# Patient Record
Sex: Female | Born: 1965 | Race: White | Hispanic: No | Marital: Married | Smoking: Never smoker
Health system: Southern US, Community
[De-identification: ages and names within clinical notes are randomized; demographics above are authoritative.]

## PROBLEM LIST (undated history)

## (undated) DIAGNOSIS — M549 Dorsalgia, unspecified: Secondary | ICD-10-CM

## (undated) DIAGNOSIS — Z01419 Encounter for gynecological examination (general) (routine) without abnormal findings: Secondary | ICD-10-CM

## (undated) HISTORY — DX: Dorsalgia, unspecified: M54.9

## (undated) HISTORY — DX: Encounter for gynecological examination (general) (routine) without abnormal findings: Z01.419

---

## 1997-12-31 ENCOUNTER — Other Ambulatory Visit: Admission: RE | Admit: 1997-12-31 | Discharge: 1997-12-31 | Payer: Self-pay | Admitting: Obstetrics and Gynecology

## 1999-04-20 ENCOUNTER — Other Ambulatory Visit: Admission: RE | Admit: 1999-04-20 | Discharge: 1999-04-20 | Payer: Self-pay | Admitting: Obstetrics and Gynecology

## 2000-06-06 ENCOUNTER — Other Ambulatory Visit: Admission: RE | Admit: 2000-06-06 | Discharge: 2000-06-06 | Payer: Self-pay | Admitting: Obstetrics and Gynecology

## 2001-08-26 ENCOUNTER — Other Ambulatory Visit: Admission: RE | Admit: 2001-08-26 | Discharge: 2001-08-26 | Payer: Self-pay | Admitting: Internal Medicine

## 2012-03-04 ENCOUNTER — Other Ambulatory Visit: Payer: Self-pay | Admitting: Family Medicine

## 2012-03-04 DIAGNOSIS — Z1231 Encounter for screening mammogram for malignant neoplasm of breast: Secondary | ICD-10-CM

## 2012-03-26 ENCOUNTER — Ambulatory Visit
Admission: RE | Admit: 2012-03-26 | Discharge: 2012-03-26 | Disposition: A | Discharge status: Home / Self Care | Payer: 59 | Source: Ambulatory Visit | Attending: Family Medicine | Admitting: Family Medicine

## 2012-03-26 DIAGNOSIS — Z1231 Encounter for screening mammogram for malignant neoplasm of breast: Secondary | ICD-10-CM

## 2013-05-16 ENCOUNTER — Other Ambulatory Visit: Payer: Self-pay

## 2013-05-16 DIAGNOSIS — Z1231 Encounter for screening mammogram for malignant neoplasm of breast: Secondary | ICD-10-CM

## 2013-06-18 ENCOUNTER — Ambulatory Visit: Payer: 59

## 2013-07-23 ENCOUNTER — Ambulatory Visit
Admission: RE | Admit: 2013-07-23 | Discharge: 2013-07-23 | Disposition: A | Discharge status: Home / Self Care | Payer: PRIVATE HEALTH INSURANCE | Source: Ambulatory Visit

## 2013-07-23 DIAGNOSIS — Z1231 Encounter for screening mammogram for malignant neoplasm of breast: Secondary | ICD-10-CM

## 2014-06-22 ENCOUNTER — Other Ambulatory Visit: Payer: Self-pay

## 2014-06-22 DIAGNOSIS — Z1231 Encounter for screening mammogram for malignant neoplasm of breast: Secondary | ICD-10-CM

## 2014-07-30 ENCOUNTER — Ambulatory Visit: Payer: PRIVATE HEALTH INSURANCE

## 2014-09-24 ENCOUNTER — Ambulatory Visit
Admission: RE | Admit: 2014-09-24 | Discharge: 2014-09-24 | Disposition: A | Discharge status: Home / Self Care | Payer: 59 | Source: Ambulatory Visit

## 2014-09-24 DIAGNOSIS — Z1231 Encounter for screening mammogram for malignant neoplasm of breast: Secondary | ICD-10-CM

## 2014-10-20 ENCOUNTER — Encounter: Payer: Self-pay | Admitting: *Deleted

## 2014-10-22 ENCOUNTER — Ambulatory Visit (INDEPENDENT_AMBULATORY_CARE_PROVIDER_SITE_OTHER): Payer: 59 | Admitting: Interventional Cardiology

## 2014-10-22 ENCOUNTER — Encounter: Payer: Self-pay | Admitting: Interventional Cardiology

## 2014-10-22 VITALS — BP 122/80 | HR 84 | Ht 65.0 in | Wt 116.0 lb

## 2014-10-22 DIAGNOSIS — R079 Chest pain, unspecified: Secondary | ICD-10-CM | POA: Diagnosis not present

## 2014-10-22 DIAGNOSIS — R0602 Shortness of breath: Secondary | ICD-10-CM

## 2014-10-22 DIAGNOSIS — R9431 Abnormal electrocardiogram [ECG] [EKG]: Secondary | ICD-10-CM | POA: Diagnosis not present

## 2014-10-22 NOTE — Patient Instructions (Signed)
Your physician recommends that you continue on your current medications as directed. Please refer to the Current Medication list given to you today.    Your physician has requested that you have a stress echocardiogram. For further information please visit [email or web address]. Please follow instruction sheet as given.  FOLLOW UP WITH DR Irish Lack AS NEEDED

## 2014-10-22 NOTE — Progress Notes (Signed)
Patient ID: Marissa Ward, female   DOB: March 04, 1966, 49 y.o.   MRN: [id number]     Cardiology Office Note   Date:  10/22/2014   ID:  Marissa Ward, DOB [birth year], MRN [id number]  PCP:  Lujean Amel, MD    No chief complaint on file. Chest tightness   Wt Readings from Last 3 Encounters:  10/22/14 116 lb (52.617 kg)       History of Present Illness: Marissa Ward is a 49 y.o. female  Who had an episode of chest tightness in early March that felt like GERD.  She had been taking Lunesta at that time.  She stopped the medicine.  She had one further episode of tightness that felt like GERD, but not as severe as the first time.   First episode was intermittent for the day.  No associated sweating or nausea.  No lightheadedness.    She walks occasionally and has no issues there.  No problems with walking stairs in her house.  She feels that she is out of shape from lack of exercise.  There has been no change in her exercise tolerance.   No prior cardiac testing. No problems walking a treadmill.  She was told that she had MVP 25 years ago.  She took antibiotic for several years.  She stopped the antibiotics.    She has noticed that she gets very [facility] with running-  This has been an issue for years.    Past Medical History  Diagnosis Date  . Visit for routine gyn exam   . Back pain     No past surgical history on file.   Current Outpatient Prescriptions  Medication Sig Dispense Refill  . ibuprofen (ADVIL,MOTRIN) 200 MG tablet Take 200 mg by mouth every 6 (six) hours as needed.    . norethindrone-ethinyl estradiol (MICROGESTIN,JUNEL,LOESTRIN) 1-20 MG-MCG tablet Take 1 tablet by mouth daily. 3 WEEKS WITH 1 WEEK OFF    . eszopiclone (LUNESTA) 2 MG TABS tablet Take 2 mg by mouth at bedtime as needed for sleep. TAKE IN PLACE OF 3 MG DOSE Take immediately before bedtime    . Eszopiclone 3 MG TABS Take 3 mg by mouth at bedtime. Take immediately before bedtime ONCE A  DAY ALTERNATING WITH 2 MG - 2 1/2 MG AT BEDTIME     No current facility-administered medications for this visit.    Allergies:   Ciprofloxacin and Sulfa antibiotics    Social History:  The patient  reports that she has never smoked. She does not have any smokeless tobacco history on file. She reports that she drinks alcohol. She reports that she does not use illicit drugs.   Family History:  The patient's family history includes Arrhythmia in her mother; Depression in her mother; Heart disease in her mother; Hypertension in her father; Thyroid disease in her mother. Mother saw a cardiologist during the last 2 months of her life. No early CAD.  Siblings are healthy.    ROS:  Please see the history of present illness.   Otherwise, review of systems are positive for chronic red eyes.  She does not sleep well.   All other systems are reviewed and negative.    PHYSICAL EXAM: VS:  BP 122/80 mmHg  Pulse 84  Ht 5\' 5"  (1.651 m)  Wt 116 lb (52.617 kg)  BMI 19.30 kg/m2 , BMI Body mass index is 19.3 kg/(m^2). GEN: Well nourished, well developed, in no acute distress HEENT: normal Neck: no JVD, carotid  bruits, or masses Cardiac: RRR; no murmurs, rubs, or gallops,no edema  Respiratory:  clear to auscultation bilaterally, normal work of breathing GI: soft, nontender, nondistended, + BS MS: no deformity or atrophy Skin: warm and dry, no rash Neuro:  Strength and sensation are intact Psych: euthymic mood, full affect   EKG:   The ekg ordered today demonstrates NSR, septal Q waves   Recent Labs: No results found for requested labs within last 365 days.   Lipid Panel No results found for: CHOL, TRIG, HDL, CHOLHDL, VLDL, LDLCALC, LDLDIRECT   Other studies Reviewed: Additional studies/ records that were reviewed today with results demonstrating: none.   ASSESSMENT AND PLAN:  1. Atypical chest pain: May be GI related per her description. We'll plan on exercise stress echo to evaluate  exercise capacity in the setting of shortness of breath, chest discomfort and abnormal ECG. She has a low pretest probability of coronary disease given her lack of risk factors. Long-term, she would benefit from getting back and exercise. This test will help [place] to give her an exercise prescription.  2. Shortness of breath: May be due from deconditioning due to lack of exercise. 3. Abnormal ECG: Septal Q waves noted in leads V1 to V3. Some mild ST segment changes noted in V1 and V2. Check for wall motion abnormalities with the echo portion of the stress echo. No sign of any valvular disease on her exam, despite her history of mitral valve prolapse. No indication for SBE prophylaxis.   Current medicines are reviewed at length with the patient today.  The patient concerns regarding her medicines were addressed.  The following changes have been made:  No change  Labs/ tests ordered today include: stress echo   Orders Placed This Encounter  Procedures  . EKG 12-Lead  . Echocardiogram stress test withOUT contrast    Recommend 150 minutes/week of aerobic exercise Low fat, low carb, high fiber diet recommended  Disposition:   FU for stress test   Teresita Madura., MD  10/22/2014 11:14 AM    [facility] [place], [place],   [address] Phone: [phone]; Fax: [phone]

## 2014-11-06 ENCOUNTER — Ambulatory Visit (HOSPITAL_COMMUNITY): Payer: 59 | Attending: Cardiology

## 2014-11-06 DIAGNOSIS — R079 Chest pain, unspecified: Secondary | ICD-10-CM | POA: Diagnosis present

## 2014-11-06 DIAGNOSIS — I341 Nonrheumatic mitral (valve) prolapse: Secondary | ICD-10-CM | POA: Diagnosis not present

## 2014-11-06 DIAGNOSIS — R0600 Dyspnea, unspecified: Secondary | ICD-10-CM | POA: Diagnosis not present

## 2014-11-06 DIAGNOSIS — R0602 Shortness of breath: Secondary | ICD-10-CM | POA: Insufficient documentation

## 2014-11-06 NOTE — Progress Notes (Signed)
Stress Echo performed.

## 2014-11-11 ENCOUNTER — Other Ambulatory Visit (HOSPITAL_COMMUNITY): Payer: 59

## 2015-09-01 ENCOUNTER — Other Ambulatory Visit: Payer: Self-pay

## 2015-09-01 DIAGNOSIS — Z1231 Encounter for screening mammogram for malignant neoplasm of breast: Secondary | ICD-10-CM

## 2015-09-30 ENCOUNTER — Ambulatory Visit
Admission: RE | Admit: 2015-09-30 | Discharge: 2015-09-30 | Disposition: A | Discharge status: Home / Self Care | Payer: 59 | Source: Ambulatory Visit

## 2015-09-30 DIAGNOSIS — Z1231 Encounter for screening mammogram for malignant neoplasm of breast: Secondary | ICD-10-CM

## 2016-08-22 ENCOUNTER — Other Ambulatory Visit: Payer: Self-pay | Admitting: Family Medicine

## 2016-08-22 DIAGNOSIS — Z1231 Encounter for screening mammogram for malignant neoplasm of breast: Secondary | ICD-10-CM

## 2016-10-05 ENCOUNTER — Ambulatory Visit: Payer: 59

## 2016-10-20 ENCOUNTER — Ambulatory Visit
Admission: RE | Admit: 2016-10-20 | Discharge: 2016-10-20 | Disposition: A | Discharge status: Home / Self Care | Payer: BLUE CROSS/BLUE SHIELD | Source: Ambulatory Visit | Attending: Family Medicine | Admitting: Family Medicine

## 2016-10-20 DIAGNOSIS — Z1231 Encounter for screening mammogram for malignant neoplasm of breast: Secondary | ICD-10-CM

## 2017-01-12 DIAGNOSIS — Z01419 Encounter for gynecological examination (general) (routine) without abnormal findings: Secondary | ICD-10-CM | POA: Diagnosis not present

## 2017-02-08 DIAGNOSIS — Z01419 Encounter for gynecological examination (general) (routine) without abnormal findings: Secondary | ICD-10-CM | POA: Diagnosis not present

## 2017-03-14 DIAGNOSIS — H1032 Unspecified acute conjunctivitis, left eye: Secondary | ICD-10-CM | POA: Diagnosis not present

## 2017-03-23 DIAGNOSIS — H01001 Unspecified blepharitis right upper eyelid: Secondary | ICD-10-CM | POA: Diagnosis not present

## 2017-03-23 DIAGNOSIS — H40013 Open angle with borderline findings, low risk, bilateral: Secondary | ICD-10-CM | POA: Diagnosis not present

## 2017-03-23 DIAGNOSIS — H01004 Unspecified blepharitis left upper eyelid: Secondary | ICD-10-CM | POA: Diagnosis not present

## 2017-04-20 DIAGNOSIS — Z23 Encounter for immunization: Secondary | ICD-10-CM | POA: Diagnosis not present

## 2017-06-28 DIAGNOSIS — D225 Melanocytic nevi of trunk: Secondary | ICD-10-CM | POA: Diagnosis not present

## 2017-06-28 DIAGNOSIS — Q825 Congenital non-neoplastic nevus: Secondary | ICD-10-CM | POA: Diagnosis not present

## 2017-06-28 DIAGNOSIS — L821 Other seborrheic keratosis: Secondary | ICD-10-CM | POA: Diagnosis not present

## 2017-10-25 ENCOUNTER — Other Ambulatory Visit: Payer: Self-pay | Admitting: Family Medicine

## 2017-10-25 DIAGNOSIS — Z1231 Encounter for screening mammogram for malignant neoplasm of breast: Secondary | ICD-10-CM

## 2017-11-16 ENCOUNTER — Ambulatory Visit
Admission: RE | Admit: 2017-11-16 | Discharge: 2017-11-16 | Disposition: A | Discharge status: Home / Self Care | Payer: 59 | Source: Ambulatory Visit | Attending: Family Medicine | Admitting: Family Medicine

## 2017-11-16 DIAGNOSIS — Z1231 Encounter for screening mammogram for malignant neoplasm of breast: Secondary | ICD-10-CM | POA: Diagnosis not present

## 2018-01-14 DIAGNOSIS — Z1389 Encounter for screening for other disorder: Secondary | ICD-10-CM | POA: Diagnosis not present

## 2018-01-14 DIAGNOSIS — Z130 Encounter for screening for diseases of the blood and blood-forming organs and certain disorders involving the immune mechanism: Secondary | ICD-10-CM | POA: Diagnosis not present

## 2018-01-14 DIAGNOSIS — Z01419 Encounter for gynecological examination (general) (routine) without abnormal findings: Secondary | ICD-10-CM | POA: Diagnosis not present

## 2018-04-29 DIAGNOSIS — R399 Unspecified symptoms and signs involving the genitourinary system: Secondary | ICD-10-CM | POA: Diagnosis not present

## 2018-07-25 DIAGNOSIS — Z1211 Encounter for screening for malignant neoplasm of colon: Secondary | ICD-10-CM | POA: Diagnosis not present

## 2018-10-07 ENCOUNTER — Other Ambulatory Visit: Payer: Self-pay | Admitting: Family Medicine

## 2018-10-07 DIAGNOSIS — Z1231 Encounter for screening mammogram for malignant neoplasm of breast: Secondary | ICD-10-CM

## 2018-11-29 ENCOUNTER — Ambulatory Visit: Payer: 59

## 2018-12-19 ENCOUNTER — Ambulatory Visit: Payer: 59

## 2019-01-20 DIAGNOSIS — Z130 Encounter for screening for diseases of the blood and blood-forming organs and certain disorders involving the immune mechanism: Secondary | ICD-10-CM | POA: Diagnosis not present

## 2019-01-20 DIAGNOSIS — Z1389 Encounter for screening for other disorder: Secondary | ICD-10-CM | POA: Diagnosis not present

## 2019-01-20 DIAGNOSIS — Z01419 Encounter for gynecological examination (general) (routine) without abnormal findings: Secondary | ICD-10-CM | POA: Diagnosis not present

## 2019-01-20 DIAGNOSIS — Z124 Encounter for screening for malignant neoplasm of cervix: Secondary | ICD-10-CM | POA: Diagnosis not present

## 2019-01-29 ENCOUNTER — Ambulatory Visit: Payer: 59

## 2019-01-29 DIAGNOSIS — Z0000 Encounter for general adult medical examination without abnormal findings: Secondary | ICD-10-CM | POA: Diagnosis not present

## 2019-01-31 DIAGNOSIS — N959 Unspecified menopausal and perimenopausal disorder: Secondary | ICD-10-CM | POA: Diagnosis not present

## 2019-01-31 DIAGNOSIS — Z13220 Encounter for screening for lipoid disorders: Secondary | ICD-10-CM | POA: Diagnosis not present

## 2019-01-31 DIAGNOSIS — R5382 Chronic fatigue, unspecified: Secondary | ICD-10-CM | POA: Diagnosis not present

## 2019-01-31 DIAGNOSIS — E611 Iron deficiency: Secondary | ICD-10-CM | POA: Diagnosis not present

## 2019-01-31 DIAGNOSIS — Z5181 Encounter for therapeutic drug level monitoring: Secondary | ICD-10-CM | POA: Diagnosis not present

## 2019-03-25 ENCOUNTER — Other Ambulatory Visit: Payer: Self-pay

## 2019-03-25 ENCOUNTER — Ambulatory Visit
Admission: RE | Admit: 2019-03-25 | Discharge: 2019-03-25 | Disposition: A | Discharge status: Home / Self Care | Payer: 59 | Source: Ambulatory Visit | Attending: Family Medicine | Admitting: Family Medicine

## 2019-03-25 DIAGNOSIS — Z1231 Encounter for screening mammogram for malignant neoplasm of breast: Secondary | ICD-10-CM

## 2019-04-09 DIAGNOSIS — R69 Illness, unspecified: Secondary | ICD-10-CM | POA: Diagnosis not present

## 2019-05-25 DIAGNOSIS — Z20828 Contact with and (suspected) exposure to other viral communicable diseases: Secondary | ICD-10-CM | POA: Diagnosis not present

## 2019-06-25 DIAGNOSIS — R69 Illness, unspecified: Secondary | ICD-10-CM | POA: Diagnosis not present

## 2019-09-13 ENCOUNTER — Ambulatory Visit: Payer: 59 | Attending: Internal Medicine

## 2019-09-13 DIAGNOSIS — Z23 Encounter for immunization: Secondary | ICD-10-CM | POA: Insufficient documentation

## 2019-09-13 NOTE — Progress Notes (Signed)
   Covid-19 Vaccination Clinic  Name:  Marissa Ward    MRN: [id number] DOB: 18-Jun-1966  09/13/2019  Marissa Ward was observed post Covid-19 immunization for 15 minutes without incidence. She was provided with Vaccine Information Sheet and instruction to access the V-Safe system.   Marissa Ward was instructed to call 911 with any severe reactions post vaccine: Marland Kitchen Difficulty breathing  . Swelling of your face and throat  . A fast heartbeat  . A bad rash all over your body  . Dizziness and weakness    Immunizations Administered    Name Date Dose VIS Date Route   Pfizer COVID-19 Vaccine 09/13/2019  9:38 AM 0.3 mL 06/27/2019 Intramuscular   Manufacturer: [place]   Lot: [id number]   [place]: [id number]

## 2019-09-17 DIAGNOSIS — R69 Illness, unspecified: Secondary | ICD-10-CM | POA: Diagnosis not present

## 2019-10-04 ENCOUNTER — Ambulatory Visit: Payer: 59 | Attending: Internal Medicine

## 2019-10-04 DIAGNOSIS — Z23 Encounter for immunization: Secondary | ICD-10-CM

## 2019-10-04 NOTE — Progress Notes (Signed)
   Covid-19 Vaccination Clinic  Name:  Marissa Ward    MRN: [id number] DOB: 13-Feb-1966  10/04/2019  Marissa Ward was observed post Covid-19 immunization for 15 minutes without incident. She was provided with Vaccine Information Sheet and instruction to access the V-Safe system.   Marissa Ward was instructed to call 911 with any severe reactions post vaccine: Marland Kitchen Difficulty breathing  . Swelling of face and throat  . A fast heartbeat  . A bad rash all over body  . Dizziness and weakness   Immunizations Administered    Name Date Dose VIS Date Route   Pfizer COVID-19 Vaccine 10/04/2019 10:22 AM 0.3 mL 06/27/2019 Intramuscular   Manufacturer: [place]   Lot: [id number]   [place]: [id number]

## 2019-10-08 ENCOUNTER — Ambulatory Visit: Payer: 59

## 2020-03-02 ENCOUNTER — Other Ambulatory Visit: Payer: Self-pay | Admitting: Family Medicine

## 2020-03-02 DIAGNOSIS — Z1231 Encounter for screening mammogram for malignant neoplasm of breast: Secondary | ICD-10-CM

## 2020-04-07 ENCOUNTER — Ambulatory Visit
Admission: RE | Admit: 2020-04-07 | Discharge: 2020-04-07 | Disposition: A | Discharge status: Home / Self Care | Payer: No Typology Code available for payment source | Source: Ambulatory Visit

## 2020-04-07 ENCOUNTER — Other Ambulatory Visit: Payer: Self-pay

## 2020-04-07 DIAGNOSIS — Z1231 Encounter for screening mammogram for malignant neoplasm of breast: Secondary | ICD-10-CM

## 2021-01-05 DIAGNOSIS — F419 Anxiety disorder, unspecified: Secondary | ICD-10-CM | POA: Diagnosis not present

## 2021-01-07 DIAGNOSIS — Z5181 Encounter for therapeutic drug level monitoring: Secondary | ICD-10-CM | POA: Diagnosis not present

## 2021-01-07 DIAGNOSIS — Z13220 Encounter for screening for lipoid disorders: Secondary | ICD-10-CM | POA: Diagnosis not present

## 2021-02-14 DIAGNOSIS — Z130 Encounter for screening for diseases of the blood and blood-forming organs and certain disorders involving the immune mechanism: Secondary | ICD-10-CM | POA: Diagnosis not present

## 2021-02-14 DIAGNOSIS — Z1389 Encounter for screening for other disorder: Secondary | ICD-10-CM | POA: Diagnosis not present

## 2021-02-14 DIAGNOSIS — Z01419 Encounter for gynecological examination (general) (routine) without abnormal findings: Secondary | ICD-10-CM | POA: Diagnosis not present

## 2021-02-14 DIAGNOSIS — N3941 Urge incontinence: Secondary | ICD-10-CM | POA: Diagnosis not present

## 2021-03-28 DIAGNOSIS — Z01419 Encounter for gynecological examination (general) (routine) without abnormal findings: Secondary | ICD-10-CM | POA: Diagnosis not present

## 2021-04-13 ENCOUNTER — Other Ambulatory Visit: Payer: Self-pay | Admitting: Obstetrics and Gynecology

## 2021-04-13 DIAGNOSIS — Z1231 Encounter for screening mammogram for malignant neoplasm of breast: Secondary | ICD-10-CM

## 2021-05-12 ENCOUNTER — Ambulatory Visit
Admission: RE | Admit: 2021-05-12 | Discharge: 2021-05-12 | Disposition: A | Discharge status: Home / Self Care | Payer: BC Managed Care – PPO | Source: Ambulatory Visit | Attending: Obstetrics and Gynecology | Admitting: Obstetrics and Gynecology

## 2021-05-12 ENCOUNTER — Other Ambulatory Visit: Payer: Self-pay

## 2021-05-12 DIAGNOSIS — Z1231 Encounter for screening mammogram for malignant neoplasm of breast: Secondary | ICD-10-CM | POA: Diagnosis not present

## 2021-09-12 DIAGNOSIS — F341 Dysthymic disorder: Secondary | ICD-10-CM | POA: Diagnosis not present

## 2021-09-22 DIAGNOSIS — F341 Dysthymic disorder: Secondary | ICD-10-CM | POA: Diagnosis not present

## 2021-10-26 DIAGNOSIS — F341 Dysthymic disorder: Secondary | ICD-10-CM | POA: Diagnosis not present

## 2021-11-03 DIAGNOSIS — F341 Dysthymic disorder: Secondary | ICD-10-CM | POA: Diagnosis not present

## 2021-11-13 IMAGING — MG MM DIGITAL SCREENING BILAT W/ TOMO AND CAD
8 series · 9 of 24 positions shown · non-contrast
Comparison: Previous exam(s).

CLINICAL DATA: Screening.

EXAM:
DIGITAL SCREENING BILATERAL MAMMOGRAM WITH TOMOSYNTHESIS AND CAD
TECHNIQUE: Bilateral screening digital craniocaudal and mediolateral oblique
mammograms were obtained. Bilateral screening digital breast
tomosynthesis was performed. The images were evaluated with
computer-aided detection.

[R MLO synth-2D]
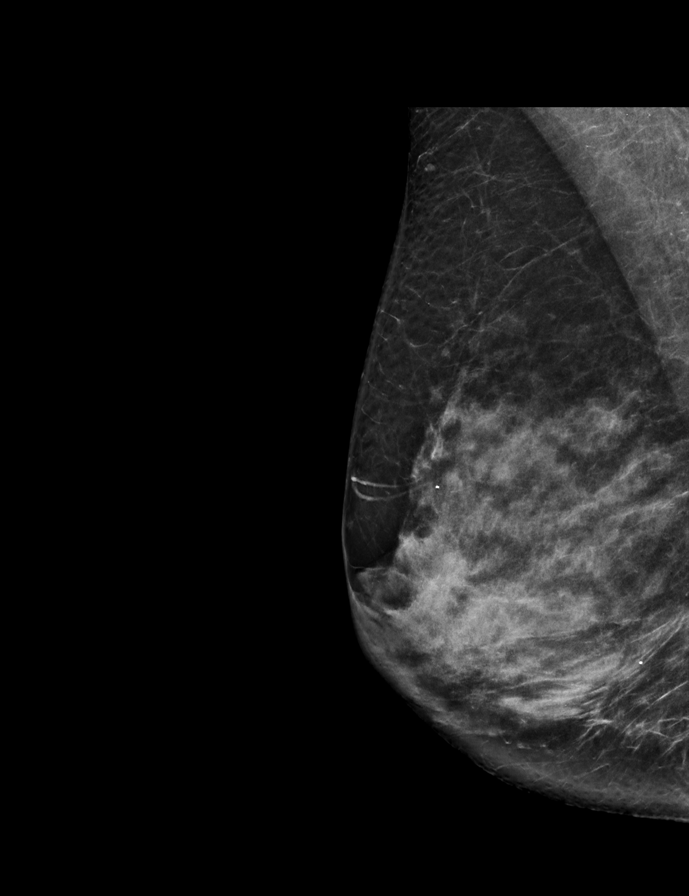

[L MLO synth-2D]
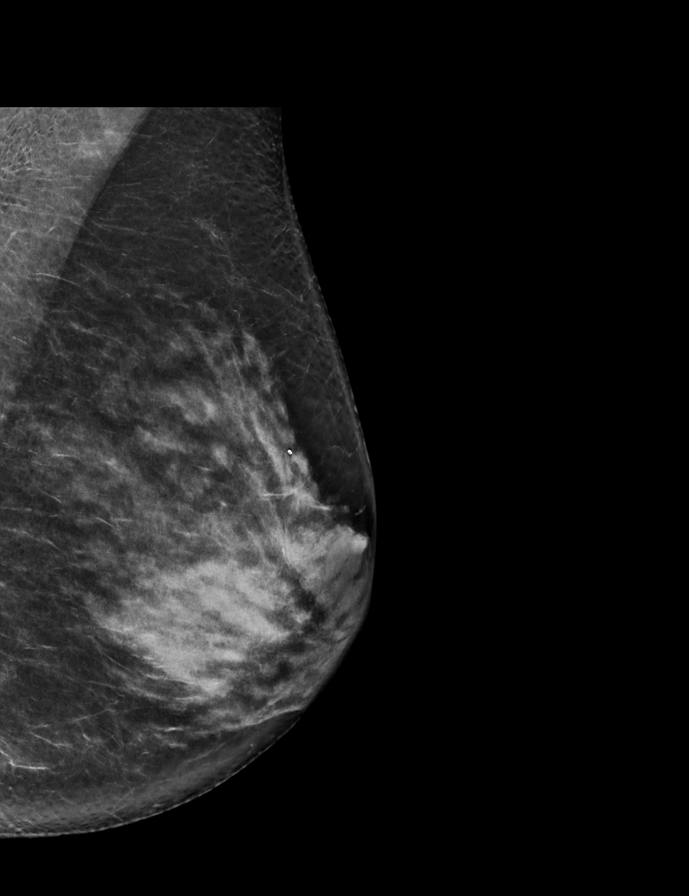

[L CC synth-2D]
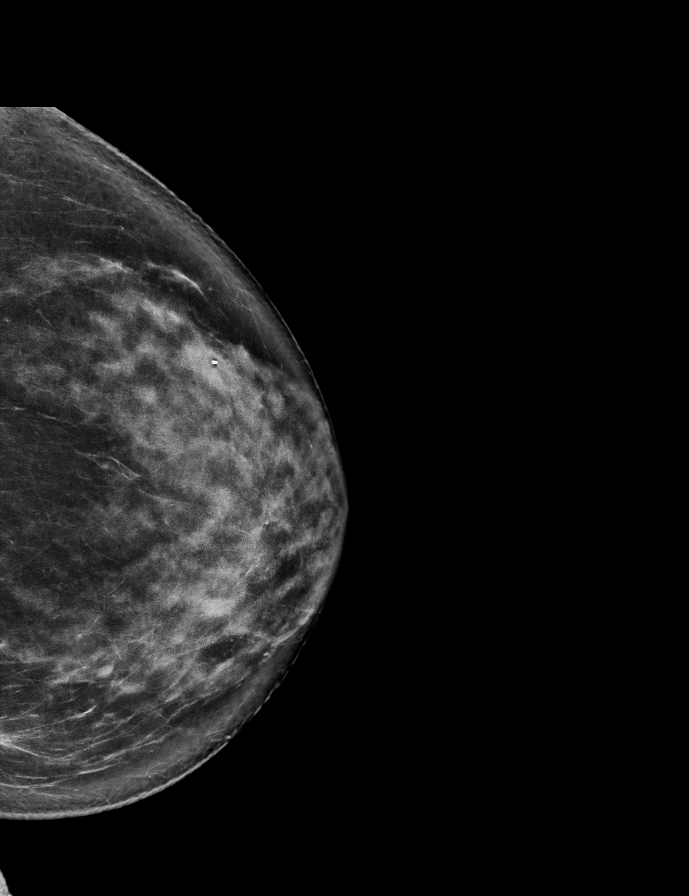

[R CC synth-2D]
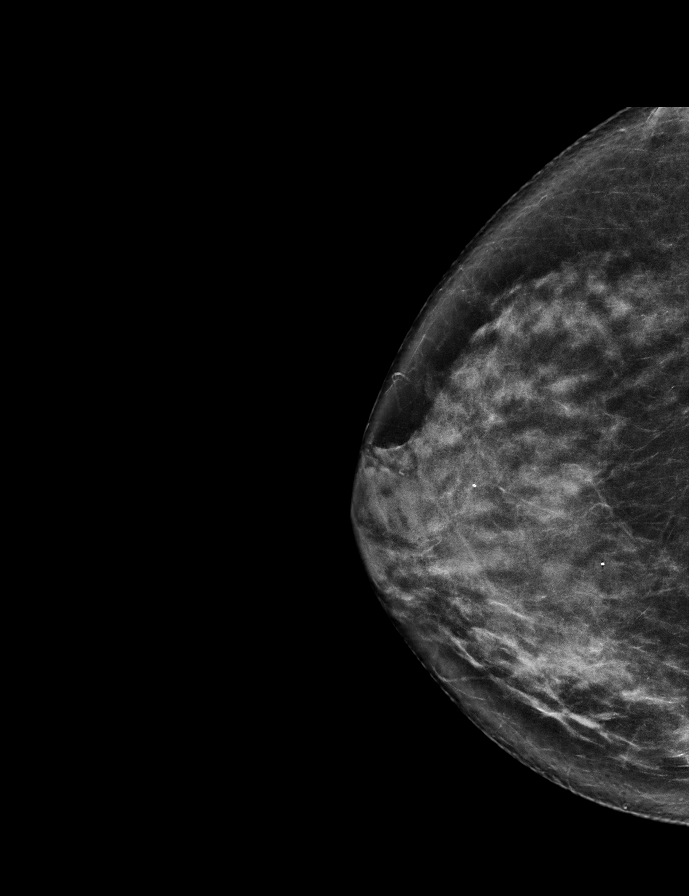

[R MLO tomo · 2 of 76 frames shown]
[frame 25/76]
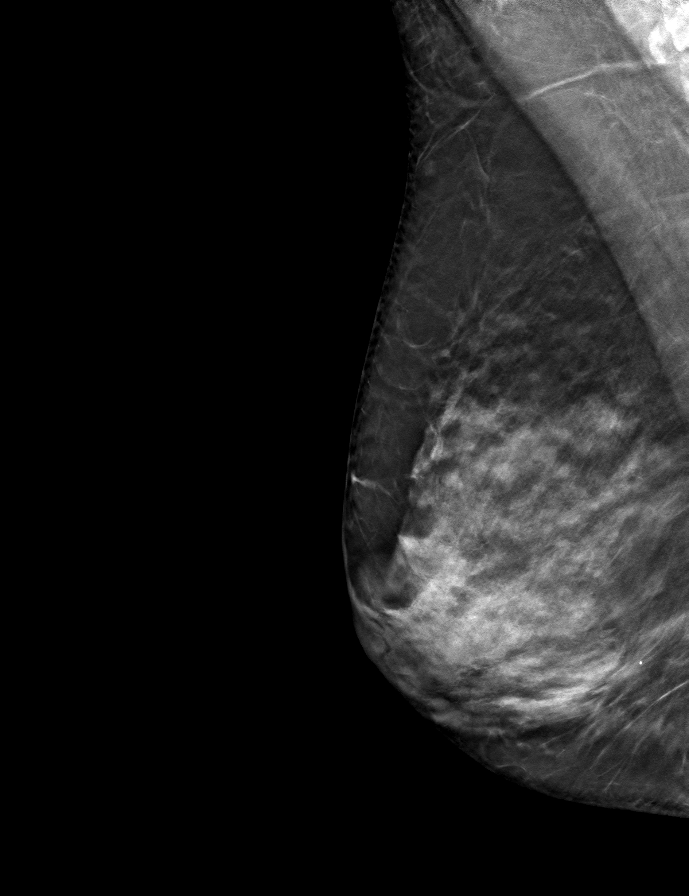
[frame 39/76]
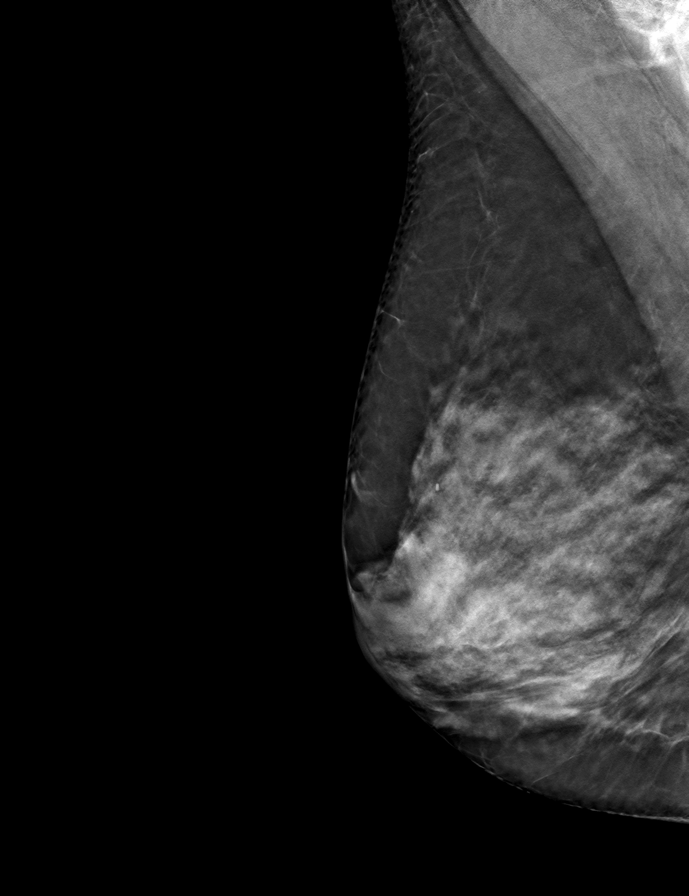

[L MLO tomo · tomo slice 41/80.0]
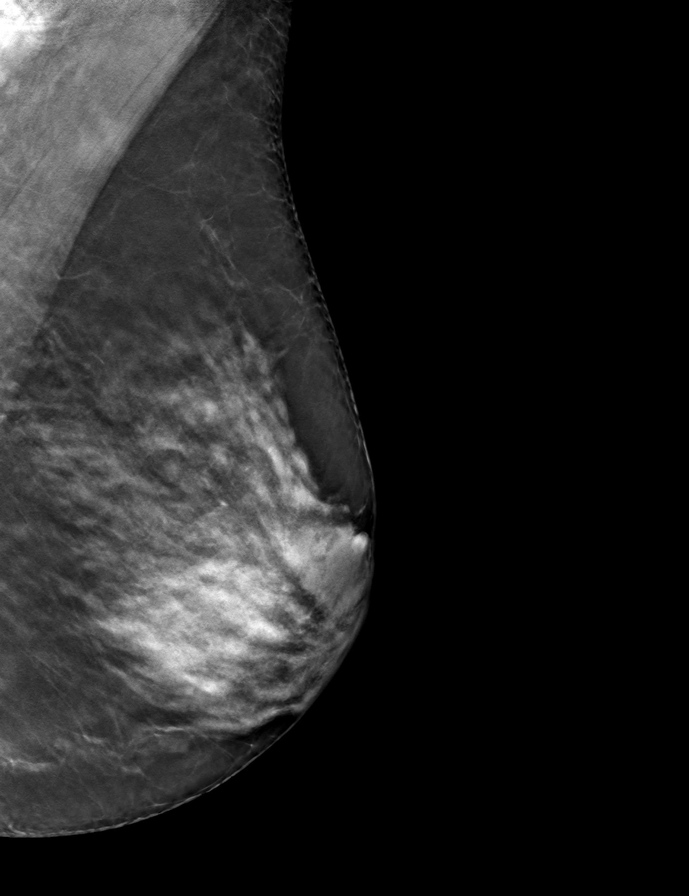

[L CC tomo · tomo slice 42/83.0]
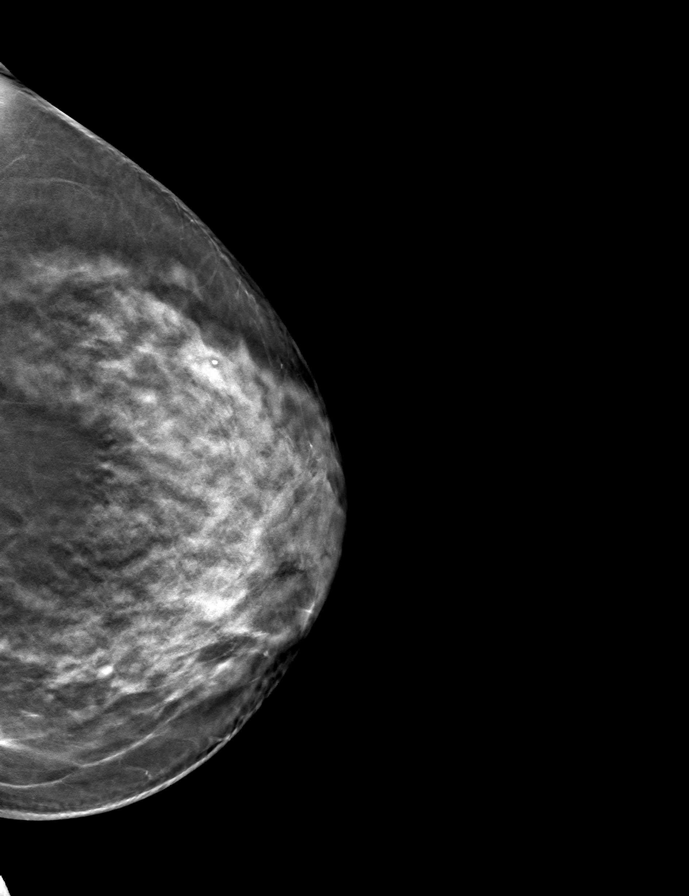

[R CC tomo · tomo slice 37/74.0]
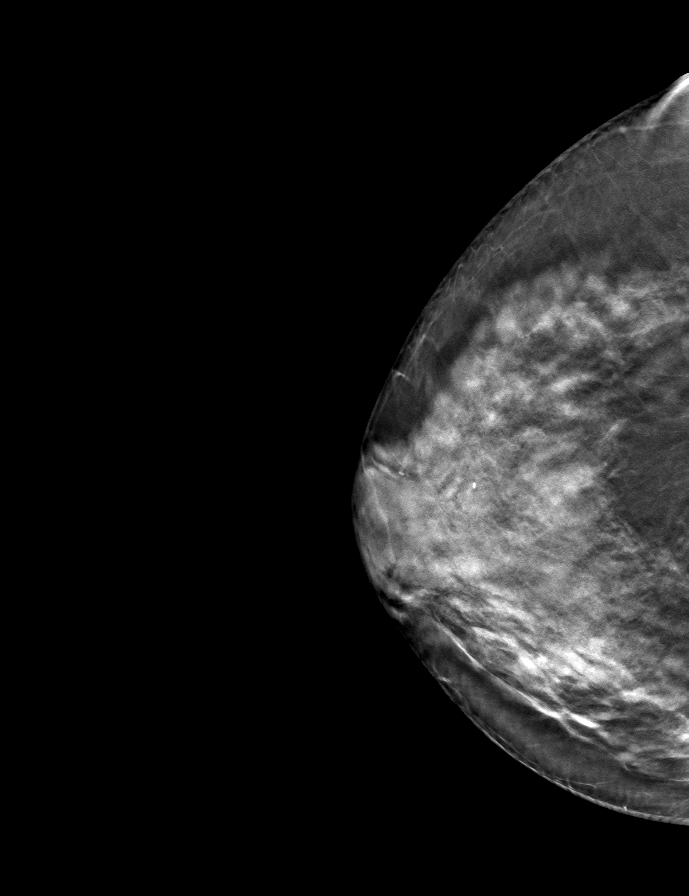

[9 of 24 positions shown; findings below may reference images not displayed]

ACR Breast Density Category d: The breast tissue is extremely dense,
which lowers the sensitivity of mammography
FINDINGS: There are no findings suspicious for malignancy.
IMPRESSION: No mammographic evidence of malignancy. A result letter of this
screening mammogram will be mailed directly to the patient.

RECOMMENDATION:
Screening mammogram in one year. (Code:TA-V-WV9)

BI-RADS CATEGORY  1: Negative.

## 2021-12-14 DIAGNOSIS — F341 Dysthymic disorder: Secondary | ICD-10-CM | POA: Diagnosis not present

## 2021-12-26 DIAGNOSIS — D2262 Melanocytic nevi of left upper limb, including shoulder: Secondary | ICD-10-CM | POA: Diagnosis not present

## 2021-12-26 DIAGNOSIS — D1801 Hemangioma of skin and subcutaneous tissue: Secondary | ICD-10-CM | POA: Diagnosis not present

## 2021-12-26 DIAGNOSIS — L821 Other seborrheic keratosis: Secondary | ICD-10-CM | POA: Diagnosis not present

## 2021-12-26 DIAGNOSIS — L814 Other melanin hyperpigmentation: Secondary | ICD-10-CM | POA: Diagnosis not present

## 2022-03-15 DIAGNOSIS — F341 Dysthymic disorder: Secondary | ICD-10-CM | POA: Diagnosis not present

## 2022-03-27 DIAGNOSIS — F341 Dysthymic disorder: Secondary | ICD-10-CM | POA: Diagnosis not present

## 2022-04-03 ENCOUNTER — Other Ambulatory Visit: Payer: Self-pay | Admitting: Obstetrics and Gynecology

## 2022-04-03 DIAGNOSIS — Z1231 Encounter for screening mammogram for malignant neoplasm of breast: Secondary | ICD-10-CM

## 2022-05-04 DIAGNOSIS — H1033 Unspecified acute conjunctivitis, bilateral: Secondary | ICD-10-CM | POA: Diagnosis not present

## 2022-06-20 ENCOUNTER — Ambulatory Visit
Admission: RE | Admit: 2022-06-20 | Discharge: 2022-06-20 | Disposition: A | Discharge status: Home / Self Care | Payer: BC Managed Care – PPO | Source: Ambulatory Visit | Attending: Obstetrics and Gynecology | Admitting: Obstetrics and Gynecology

## 2022-06-20 DIAGNOSIS — Z1231 Encounter for screening mammogram for malignant neoplasm of breast: Secondary | ICD-10-CM | POA: Diagnosis not present

## 2022-07-25 DIAGNOSIS — Z8249 Family history of ischemic heart disease and other diseases of the circulatory system: Secondary | ICD-10-CM | POA: Diagnosis not present

## 2022-07-25 DIAGNOSIS — E559 Vitamin D deficiency, unspecified: Secondary | ICD-10-CM | POA: Diagnosis not present

## 2022-07-25 DIAGNOSIS — Z79899 Other long term (current) drug therapy: Secondary | ICD-10-CM | POA: Diagnosis not present

## 2022-07-25 DIAGNOSIS — Z0000 Encounter for general adult medical examination without abnormal findings: Secondary | ICD-10-CM | POA: Diagnosis not present

## 2022-07-25 DIAGNOSIS — E7800 Pure hypercholesterolemia, unspecified: Secondary | ICD-10-CM | POA: Diagnosis not present

## 2022-07-28 ENCOUNTER — Other Ambulatory Visit: Payer: Self-pay | Admitting: Family Medicine

## 2022-07-28 DIAGNOSIS — E2839 Other primary ovarian failure: Secondary | ICD-10-CM

## 2022-09-13 DIAGNOSIS — Z01419 Encounter for gynecological examination (general) (routine) without abnormal findings: Secondary | ICD-10-CM | POA: Diagnosis not present

## 2022-09-13 DIAGNOSIS — Z1389 Encounter for screening for other disorder: Secondary | ICD-10-CM | POA: Diagnosis not present

## 2022-09-26 DIAGNOSIS — F341 Dysthymic disorder: Secondary | ICD-10-CM | POA: Diagnosis not present

## 2022-12-21 DIAGNOSIS — F341 Dysthymic disorder: Secondary | ICD-10-CM | POA: Diagnosis not present

## 2023-01-10 ENCOUNTER — Ambulatory Visit
Admission: RE | Admit: 2023-01-10 | Discharge: 2023-01-10 | Disposition: A | Discharge status: Home / Self Care | Payer: BC Managed Care – PPO | Source: Ambulatory Visit | Attending: Family Medicine | Admitting: Family Medicine

## 2023-01-10 DIAGNOSIS — E2839 Other primary ovarian failure: Secondary | ICD-10-CM

## 2023-01-10 DIAGNOSIS — N958 Other specified menopausal and perimenopausal disorders: Secondary | ICD-10-CM | POA: Diagnosis not present

## 2023-01-10 DIAGNOSIS — E349 Endocrine disorder, unspecified: Secondary | ICD-10-CM | POA: Diagnosis not present

## 2023-01-12 DIAGNOSIS — E559 Vitamin D deficiency, unspecified: Secondary | ICD-10-CM | POA: Diagnosis not present

## 2023-01-12 DIAGNOSIS — F419 Anxiety disorder, unspecified: Secondary | ICD-10-CM | POA: Diagnosis not present

## 2023-01-12 DIAGNOSIS — M816 Localized osteoporosis [Lequesne]: Secondary | ICD-10-CM | POA: Diagnosis not present

## 2023-02-28 DIAGNOSIS — L820 Inflamed seborrheic keratosis: Secondary | ICD-10-CM | POA: Diagnosis not present

## 2023-02-28 DIAGNOSIS — L814 Other melanin hyperpigmentation: Secondary | ICD-10-CM | POA: Diagnosis not present

## 2023-02-28 DIAGNOSIS — L918 Other hypertrophic disorders of the skin: Secondary | ICD-10-CM | POA: Diagnosis not present

## 2023-02-28 DIAGNOSIS — L821 Other seborrheic keratosis: Secondary | ICD-10-CM | POA: Diagnosis not present

## 2023-02-28 DIAGNOSIS — D225 Melanocytic nevi of trunk: Secondary | ICD-10-CM | POA: Diagnosis not present

## 2023-04-16 DIAGNOSIS — E559 Vitamin D deficiency, unspecified: Secondary | ICD-10-CM | POA: Diagnosis not present

## 2023-10-12 LAB — EXTERNAL GENERIC LAB PROCEDURE: COLOGUARD: NEGATIVE

## 2023-10-30 ENCOUNTER — Other Ambulatory Visit: Payer: Self-pay | Admitting: Obstetrics and Gynecology

## 2023-10-30 DIAGNOSIS — Z1231 Encounter for screening mammogram for malignant neoplasm of breast: Secondary | ICD-10-CM
